# Patient Record
Sex: Male | Born: 1965 | Race: Black or African American | Hispanic: No | Smoking: Never smoker
Health system: Southern US, Community
[De-identification: ages and names within clinical notes are randomized; demographics above are authoritative.]

## PROBLEM LIST (undated history)

## (undated) DIAGNOSIS — M5430 Sciatica, unspecified side: Secondary | ICD-10-CM

## (undated) HISTORY — PX: BACK SURGERY: SHX140

## (undated) HISTORY — PX: KNEE SURGERY: SHX244

---

## 2021-07-15 ENCOUNTER — Emergency Department (HOSPITAL_COMMUNITY)
Admission: EM | Admit: 2021-07-15 | Discharge: 2021-07-15 | Disposition: A | Discharge status: Home / Self Care | Payer: Medicare Other | Attending: Emergency Medicine | Admitting: Emergency Medicine

## 2021-07-15 ENCOUNTER — Encounter (HOSPITAL_COMMUNITY): Payer: Self-pay

## 2021-07-15 ENCOUNTER — Emergency Department (HOSPITAL_COMMUNITY): Payer: Medicare Other

## 2021-07-15 ENCOUNTER — Other Ambulatory Visit: Payer: Self-pay

## 2021-07-15 DIAGNOSIS — M79652 Pain in left thigh: Secondary | ICD-10-CM | POA: Diagnosis not present

## 2021-07-15 DIAGNOSIS — M5432 Sciatica, left side: Secondary | ICD-10-CM | POA: Insufficient documentation

## 2021-07-15 DIAGNOSIS — M25552 Pain in left hip: Secondary | ICD-10-CM | POA: Insufficient documentation

## 2021-07-15 HISTORY — DX: Sciatica, unspecified side: M54.30

## 2021-07-15 MED ORDER — KETOROLAC TROMETHAMINE 60 MG/2ML IM SOLN
60.0000 mg | Freq: Once | INTRAMUSCULAR | Status: AC
  Administered 2021-07-15: 60 mg via INTRAMUSCULAR
  Filled 2021-07-15: qty 2

## 2021-07-15 MED ORDER — PREDNISONE 20 MG PO TABS: 40.0000 mg | ORAL_TABLET | Freq: Every day | ORAL | 0 refills | Status: AC

## 2021-07-15 MED ORDER — CYCLOBENZAPRINE HCL 10 MG PO TABS
10.0000 mg | ORAL_TABLET | Freq: Once | ORAL | Status: AC
  Administered 2021-07-15: 10 mg via ORAL
  Filled 2021-07-15: qty 1

## 2021-07-15 NOTE — Discharge Instructions (Addendum)
You came to the emergency department today to be evaluated for your left leg/back pain.  Your physical exam was reassuring.  The x-ray of your left hip showed no broken bones or dislocations.  Your pain is likely due to sciatica.  Due to this I have given you prescription for prednisone.  Please take this medication as prescribed.  I have given you information to follow-up with an orthopedic provider.  Please schedule an appointment if you continue to have back pain.  Please follow-up with your primary care provider as they other ones prescribing her gabapentin, Flexeril, and meloxicam.  I have given you a prescription for steroids today.  Some common side effects include feelings of extra energy, feeling warm, increased appetite, and stomach upset.  If you are diabetic your sugars may run higher than usual.    Get help right away if: You are not able to control when you urinate or have bowel movements (incontinence). You have: Weakness in your lower back, pelvis, buttocks, or legs that gets worse. Redness or swelling of your back. A burning sensation when you urinate.

## 2021-07-15 NOTE — ED Triage Notes (Signed)
Patient c/o left hip pain that radiates down the left leg. Patient reports a history of sciatica.  Patient states he has been taking gabapentin, tramadol, meloxicam with no relief.

## 2021-07-15 NOTE — ED Provider Notes (Signed)
[facility]-EMERGENCY DEPT Provider Note   CSN: [id number] Arrival date & time: 07/15/21  1544     History Chief Complaint  Patient presents with   Hip Pain    Spencer Lamb is a 55 y.o. male with reported history of sciatica, previous lumbar fusion.  Presents to the emergency department with a chief complaint of left hip pain.  Patient reports that pain started on Monday.  Pain has been intermittent since then.  Pain is worse with movement and walking.  Pain improves when he is laying down.  Patient has had minimal relief with prescribed gabapentin, tramadol, and meloxicam.  Patient rates pain 8/10 on the pain scale.  Patient describes pain as "aching."  Pain radiates to left thigh.  Patient denies any recent falls or injuries.  Patient reports that pain feels similar to previous episodes of sciatica.     Hip Pain Pertinent negatives include no chest pain, no abdominal pain, no headaches and no shortness of breath.      Past Medical History:  Diagnosis Date   Sciatica     There are no problems to display for this patient.   Past Surgical History:  Procedure Laterality Date   BACK SURGERY     KNEE SURGERY Right        Family History  Problem Relation Age of Onset   Depression Mother    Heart attack Mother    Cancer Father     Social History   Tobacco Use   Smoking status: Never   Smokeless tobacco: Never  Vaping Use   Vaping Use: Never used  Substance Use Topics   Alcohol use: Yes    Comment: occasionally   Drug use: Never    Home Medications Prior to Admission medications   Not on File    Allergies    Patient has no known allergies.  Review of Systems   Review of Systems  Constitutional:  Negative for chills and fever.  Eyes:  Negative for visual disturbance.  Respiratory:  Negative for shortness of breath.   Cardiovascular:  Negative for chest pain.  Gastrointestinal:  Negative for abdominal pain, nausea and vomiting.   Genitourinary:  Negative for difficulty urinating.  Musculoskeletal:  Positive for arthralgias and myalgias. Negative for back pain, gait problem, joint swelling and neck pain.  Skin:  Negative for color change, pallor, rash and wound.  Neurological:  Negative for dizziness, syncope, weakness, light-headedness, numbness and headaches.  Psychiatric/Behavioral:  Negative for confusion.    Physical Exam Updated Vital Signs BP (!) 148/106 (BP Location: Left Arm)   Pulse 86   Temp 98.5 F (36.9 C) (Oral)   Resp 16   Ht 5\' 11"  (1.803 m)   Wt 118.8 kg   SpO2 95%   BMI 36.54 kg/m   Physical Exam Vitals and nursing note reviewed.  Constitutional:      General: He is not in acute distress.    Appearance: He is not ill-appearing, toxic-appearing or diaphoretic.  HENT:     Head: Normocephalic.  Eyes:     General: No scleral icterus.       Right eye: No discharge.        Left eye: No discharge.  Cardiovascular:     Rate and Rhythm: Normal rate.     Pulses:          Dorsalis pedis pulses are 2+ on the right side and 2+ on the left side.  Pulmonary:     Effort:  Pulmonary effort is normal.  Musculoskeletal:     Cervical back: No swelling, edema, deformity, erythema, signs of trauma, lacerations, rigidity, spasms, torticollis, tenderness, bony tenderness or crepitus. No pain with movement. Normal range of motion.     Thoracic back: No swelling, edema, deformity, signs of trauma, lacerations, spasms, tenderness or bony tenderness.     Lumbar back: No swelling, edema, deformity, signs of trauma, lacerations, spasms, tenderness or bony tenderness. Positive left straight leg raise test. Negative right straight leg raise test.     Right hip: No deformity, lacerations, tenderness, bony tenderness or crepitus.     Left hip: Tenderness and bony tenderness present. No deformity, lacerations or crepitus.     Right upper leg: Normal.     Left upper leg: Tenderness present. No swelling, edema,  deformity, lacerations or bony tenderness.     Right knee: No swelling, deformity, effusion, erythema, ecchymosis, lacerations, bony tenderness or crepitus. Normal range of motion. No tenderness. Normal alignment.     Left knee: No swelling, deformity, effusion, erythema, ecchymosis, lacerations, bony tenderness or crepitus. Normal range of motion. No tenderness. Normal alignment.     Right lower leg: Normal.     Left lower leg: Normal.     Right ankle: No swelling, deformity, ecchymosis or lacerations. No tenderness. Normal range of motion.     Left ankle: No swelling, deformity, ecchymosis or lacerations. No tenderness.     Right foot: Normal range of motion and normal capillary refill. No swelling, deformity, laceration, tenderness, bony tenderness or crepitus. Normal pulse.     Left foot: Normal range of motion and normal capillary refill. No swelling, deformity, laceration, tenderness, bony tenderness or crepitus. Normal pulse.     Comments: Patient has tenderness to superior aspect of left buttocks.  No midline tenderness or deformity to cervical, thoracic, or lumbar spine.  Patient has well-healed surgical scar scar to midline lumbar back.  Feet:     Right foot:     Skin integrity: Skin integrity normal.     Left foot:     Skin integrity: Skin integrity normal.  Skin:    General: Skin is warm and dry.  Neurological:     General: No focal deficit present.     Mental Status: He is alert.     GCS: GCS eye subscore is 4. GCS verbal subscore is 5. GCS motor subscore is 6.  Psychiatric:        Behavior: Behavior is cooperative.    ED Results / Procedures / Treatments   Labs (all labs ordered are listed, but only abnormal results are displayed) Labs Reviewed - No data to display  EKG None  Radiology DG Hip Unilat W or Wo Pelvis 2-3 Views Left  Result Date: 07/15/2021 CLINICAL DATA:  Left posterior hip pain. EXAM: DG HIP (WITH OR WITHOUT PELVIS) 2-3V LEFT COMPARISON:  None.  FINDINGS: There is no acute fracture or dislocation. Joint spaces are well maintained. There is minimal degenerative osteophyte formation of the superior acetabuli bilaterally. No focal osseous lesions are identified. Soft tissues are within normal limits. IMPRESSION: Negative. Electronically Signed   By: Darliss Cheney M.D.   On: 07/15/2021 17:47    Procedures Procedures   Medications Ordered in ED Medications  cyclobenzaprine (FLEXERIL) tablet 10 mg (10 mg Oral Given 07/15/21 1734)  ketorolac (TORADOL) injection 60 mg (60 mg Intramuscular Given 07/15/21 1735)    ED Course  I have reviewed the triage vital signs and the nursing notes.  Pertinent  labs & imaging results that were available during my care of the patient were reviewed by me and considered in my medical decision making (see chart for details).    MDM Rules/Calculators/A&P                           Alert 55 year old male no acute distress, nontoxic-appearing.  Presents to ED with chief complaint of left hip pain.  No recent falls or injuries.  No midline tenderness or deformity to cervical, thoracic, lumbar spine.  Patient has tenderness to superior aspect of left buttocks and left thigh.  Will obtain x-ray imaging of left hip to evaluate for possible fracture or dislocation.  We will give patient Flexeril and Toradol.  If x-ray imaging shows no fracture or dislocation we will give patient for course of prednisone to treat for possible sciatica.  Patient reports improvement in his symptoms after receiving Toradol and Flexeril.  X-ray imaging shows no acute osseous abnormality.  Patient reports he has prescription for Flexeril.  Will prescribe patient with short course of prednisone.  Patient given information to follow-up with orthopedic provider.  Patient will follow-up with PCP as prescriber for Flexeril and gabapentin.  Discussed results, findings, treatment and follow up. Patient advised of return precautions. Patient  verbalized understanding and agreed with plan.   Final Clinical Impression(s) / ED Diagnoses Final diagnoses:  Sciatica of left side    Rx / DC Orders ED Discharge Orders          Ordered    predniSONE (DELTASONE) 20 MG tablet  Daily with breakfast        07/15/21 1815             Haskel Schroeder, PA-C 07/15/21 2359    [name], MD 07/16/21 1108

## 2021-07-15 NOTE — ED Provider Notes (Signed)
Emergency Medicine Provider Triage Evaluation Note  Spencer Lamb , a 55 y.o. male  was evaluated in triage.  Pt complains of left hip pain.  Pain started on Monday.  Patient denies any recent falls or injuries.  Endorses history of sciatica.  Reports minimal relief with gabapentin, tramadol, and meloxicam.  Review of Systems  Positive: Left hip pain Negative: Numbness, weakness, rash, erythema  Physical Exam  BP (!) 148/106 (BP Location: Left Arm)   Pulse 86   Temp 98.5 F (36.9 C) (Oral)   Resp 16   SpO2 95%  Gen:   Awake, no distress   Resp:  Normal effort  MSK:   Moves extremities without difficulty, miss to superior aspect of left buttocks. Other:    Male RN present in room as chaperone.  Medical Decision Making  Medically screening exam initiated at 4:25 PM.  Appropriate orders placed.  Spencer Lamb was informed that the remainder of the evaluation will be completed by another provider, this initial triage assessment does not replace that evaluation, and the importance of remaining in the ED until their evaluation is complete.  The patient appears stable so that the remainder of the work up may be completed by another provider.      Haskel Schroeder, PA-C 07/15/21 1627    [name], MD 07/16/21 1108

## 2023-08-09 IMAGING — CR DG HIP (WITH OR WITHOUT PELVIS) 2-3V*L*
3 series · 3 of 3 positions shown · non-contrast
Comparison: None.

CLINICAL DATA: Left posterior hip pain.

EXAM:
DG HIP (WITH OR WITHOUT PELVIS) 2-3V LEFT

[t pelvis ap]
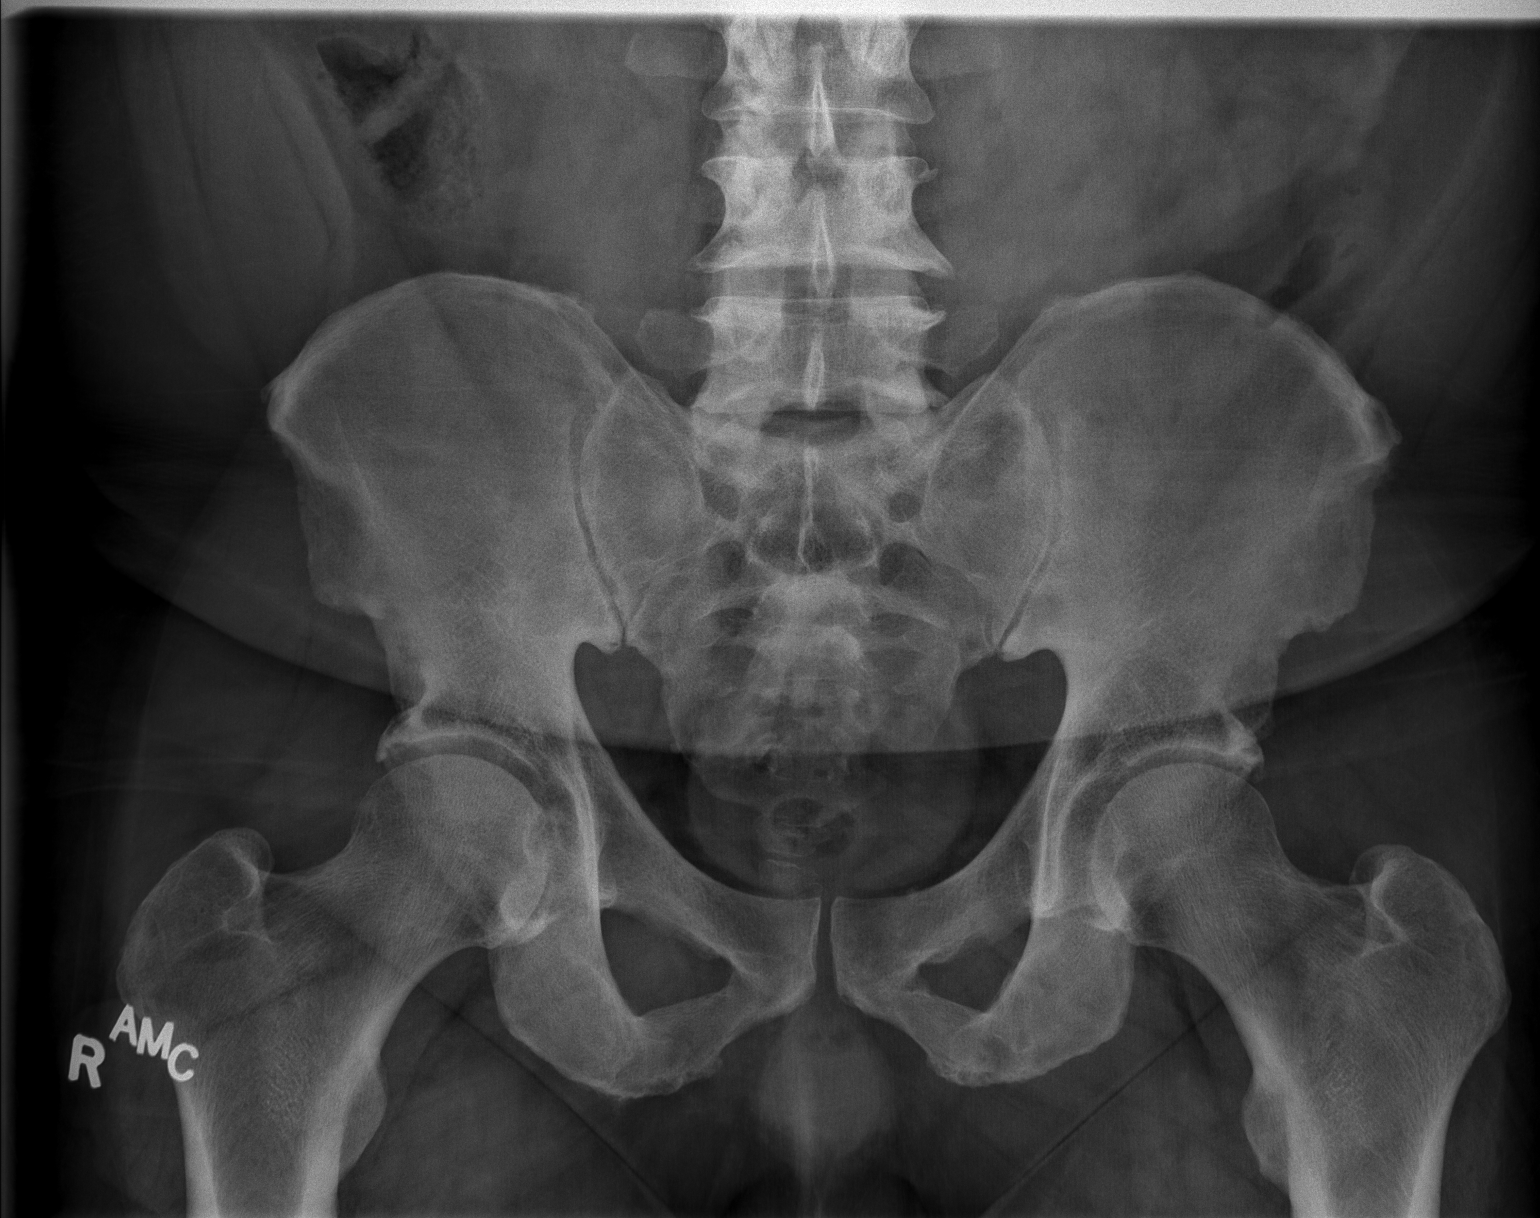

[t hip ap left]
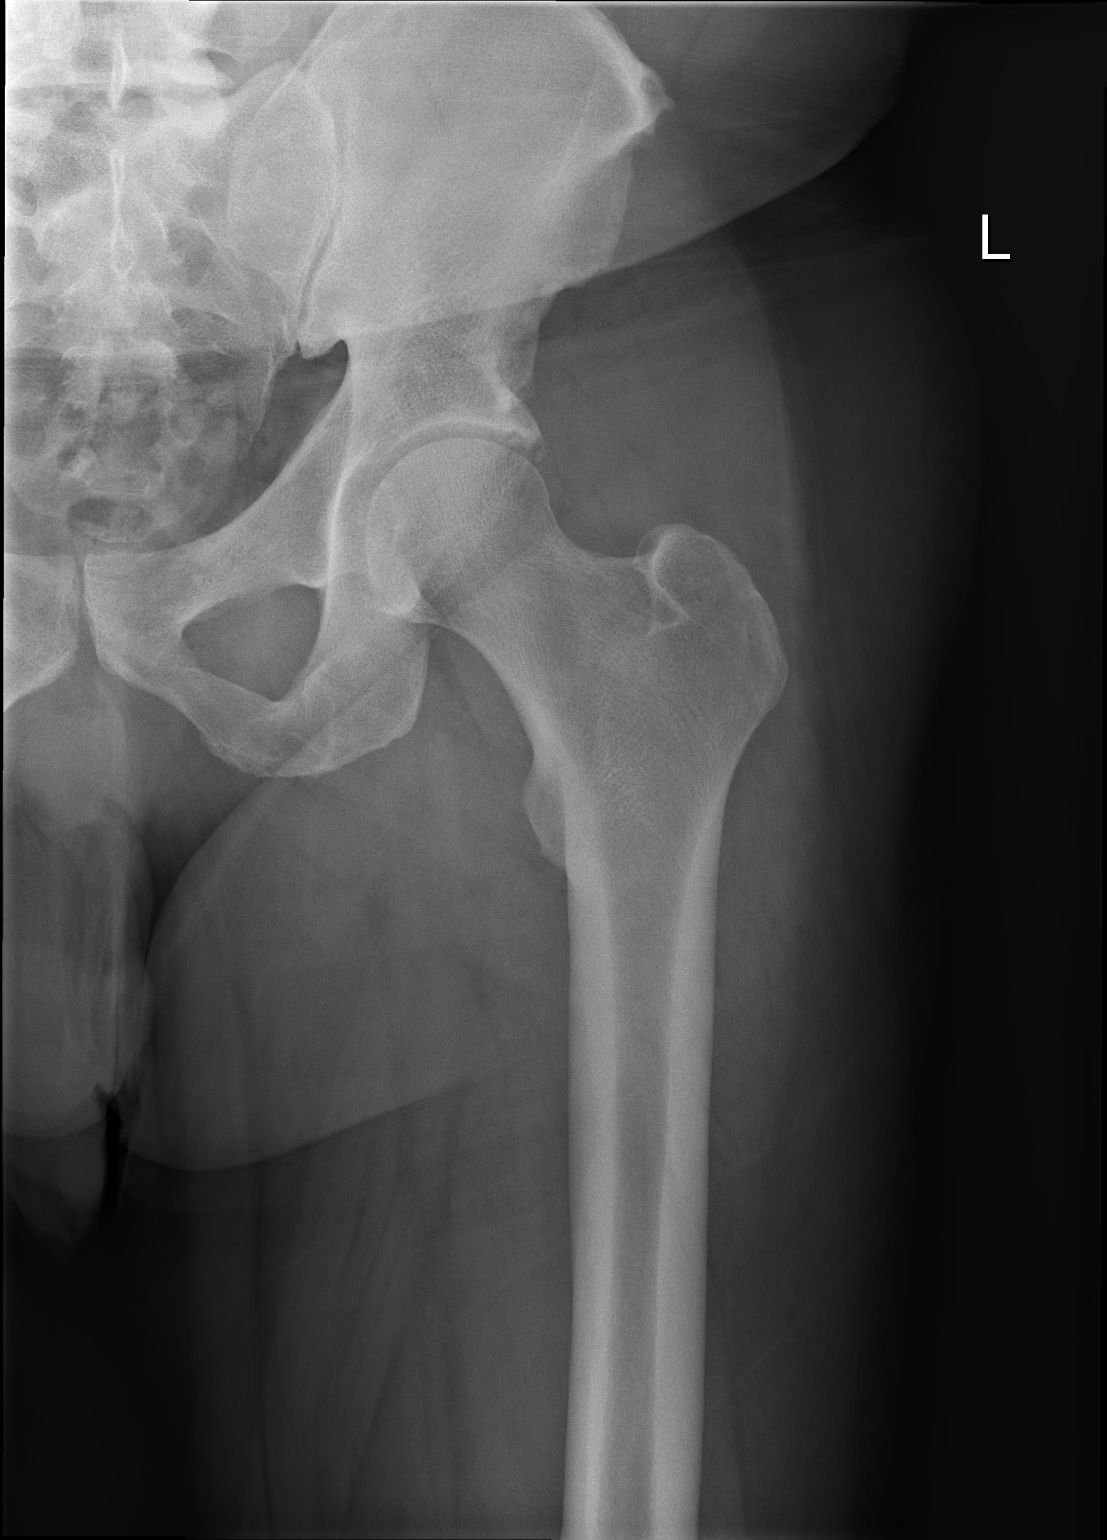

[t hip frog leg left]
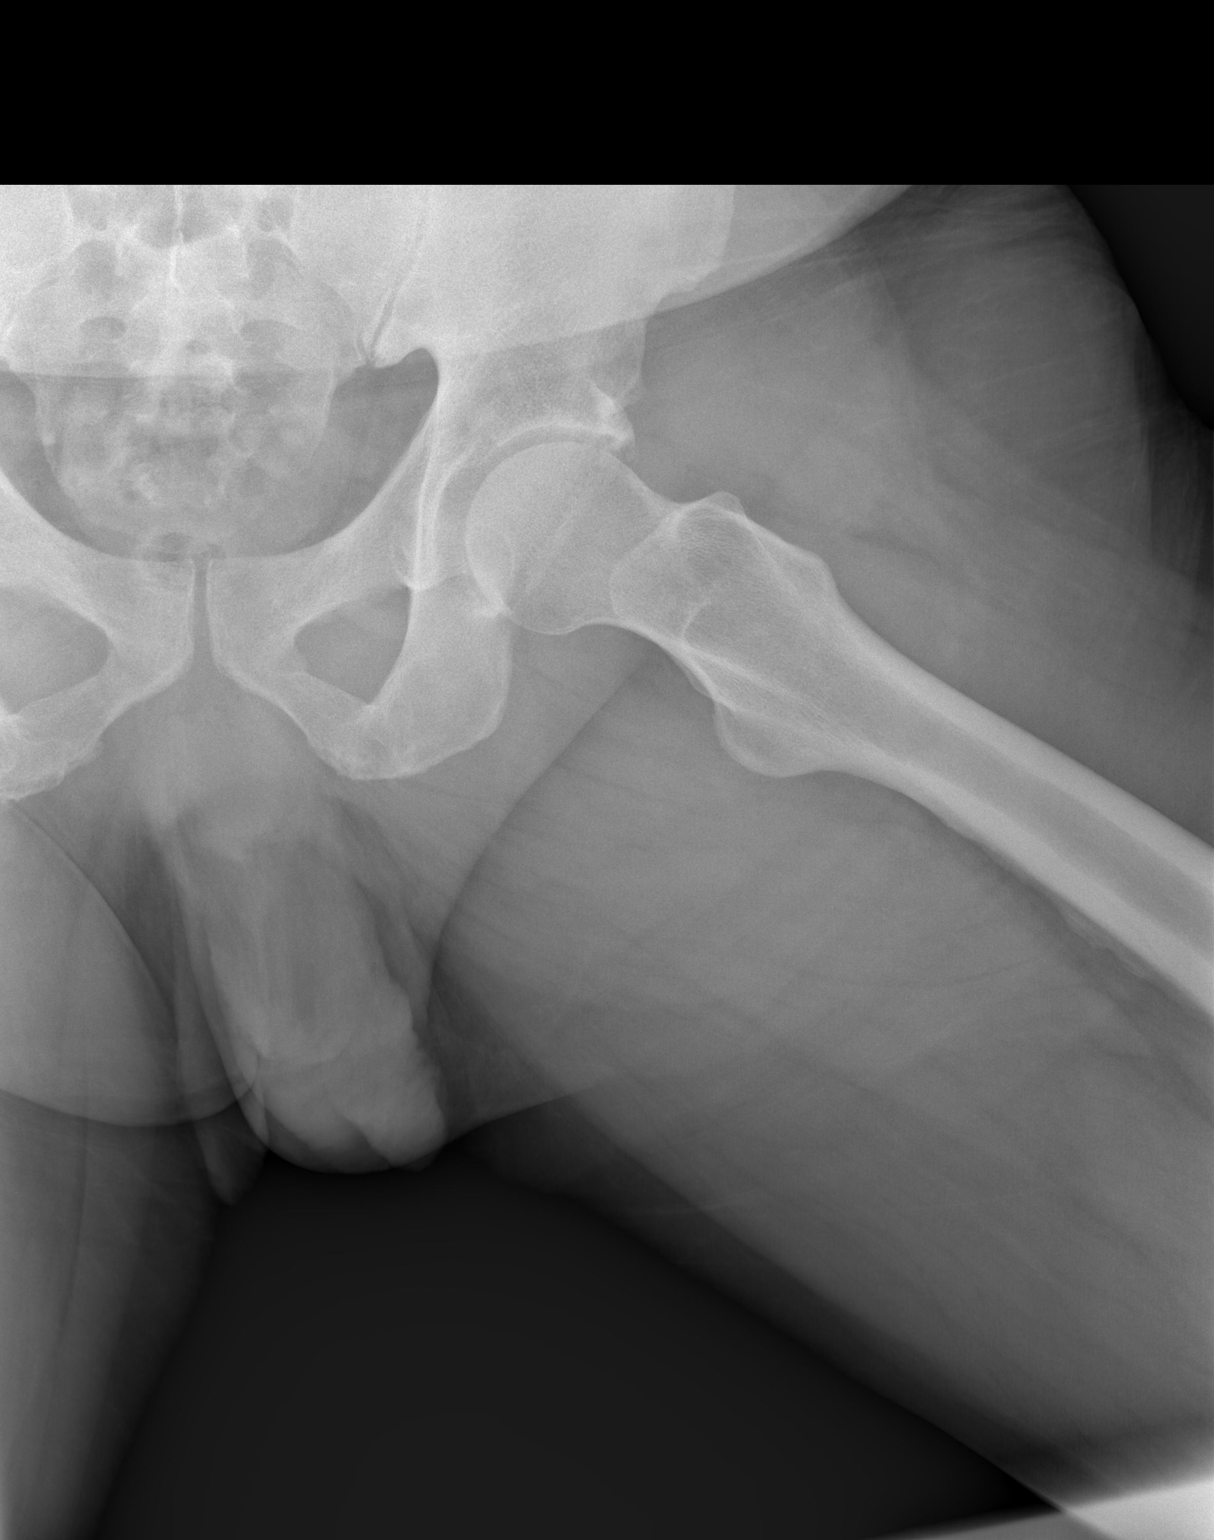

[3 of 3 positions shown; findings below may reference images not displayed]

FINDINGS: There is no acute fracture or dislocation. Joint spaces are well
maintained. There is minimal degenerative osteophyte formation of
the superior acetabuli bilaterally. No focal osseous lesions are
identified. Soft tissues are within normal limits.
IMPRESSION: Negative.

## 2024-02-27 ENCOUNTER — Other Ambulatory Visit (HOSPITAL_COMMUNITY): Payer: Self-pay

## 2024-03-03 ENCOUNTER — Other Ambulatory Visit (HOSPITAL_COMMUNITY): Payer: Self-pay

## 2024-03-03 ENCOUNTER — Other Ambulatory Visit: Payer: Self-pay

## 2024-03-03 MED ORDER — LISINOPRIL-HYDROCHLOROTHIAZIDE 10-12.5 MG PO TABS
1.0000 | ORAL_TABLET | Freq: Every day | ORAL | 1 refills | Status: AC
  Filled 2024-03-03: qty 90, 90d supply, fill #0
  Filled 2024-08-12: qty 90, 90d supply, fill #1

## 2024-03-03 MED ORDER — ALLOPURINOL 300 MG PO TABS
300.0000 mg | ORAL_TABLET | Freq: Every day | ORAL | 0 refills | Status: AC
  Filled 2024-03-03 (×2): qty 40, 90d supply, fill #0

## 2024-03-03 MED ORDER — FAMOTIDINE 20 MG PO TABS
20.0000 mg | ORAL_TABLET | Freq: Every day | ORAL | 0 refills | Status: DC
  Filled 2024-03-03: qty 90, 90d supply, fill #0

## 2024-04-02 ENCOUNTER — Other Ambulatory Visit (HOSPITAL_COMMUNITY): Payer: Self-pay

## 2024-04-02 MED ORDER — SIMVASTATIN 40 MG PO TABS
40.0000 mg | ORAL_TABLET | Freq: Every day | ORAL | 0 refills | Status: DC
  Filled 2024-04-02: qty 90, 90d supply, fill #0

## 2024-05-02 ENCOUNTER — Encounter: Payer: Self-pay | Admitting: Internal Medicine

## 2024-05-23 ENCOUNTER — Other Ambulatory Visit: Payer: Self-pay

## 2024-05-23 ENCOUNTER — Other Ambulatory Visit (HOSPITAL_COMMUNITY): Payer: Self-pay

## 2024-05-23 MED ORDER — ALLOPURINOL 300 MG PO TABS
300.0000 mg | ORAL_TABLET | ORAL | 3 refills | Status: AC
  Filled 2024-05-23: qty 36, 84d supply, fill #0
  Filled 2024-05-23: qty 12, 84d supply, fill #0
  Filled 2024-06-09: qty 36, 84d supply, fill #0
  Filled 2024-09-04: qty 36, 84d supply, fill #1

## 2024-05-23 MED ORDER — FAMOTIDINE 20 MG PO TABS
20.0000 mg | ORAL_TABLET | Freq: Every day | ORAL | 0 refills | Status: DC
  Filled 2024-05-23 – 2024-06-09 (×3): qty 90, 90d supply, fill #0

## 2024-05-23 MED ORDER — MECLIZINE HCL 25 MG PO TABS
25.0000 mg | ORAL_TABLET | Freq: Every day | ORAL | 5 refills | Status: AC | PRN
  Filled 2024-05-23 – 2024-06-09 (×3): qty 60, 60d supply, fill #0

## 2024-05-28 ENCOUNTER — Other Ambulatory Visit: Payer: Self-pay

## 2024-06-09 ENCOUNTER — Other Ambulatory Visit (HOSPITAL_COMMUNITY): Payer: Self-pay

## 2024-06-23 ENCOUNTER — Other Ambulatory Visit (HOSPITAL_COMMUNITY): Payer: Self-pay

## 2024-06-23 MED ORDER — HYDROCODONE-ACETAMINOPHEN 5-325 MG PO TABS
1.0000 | ORAL_TABLET | Freq: Four times a day (QID) | ORAL | 0 refills | Status: AC
  Filled 2024-06-23 (×2): qty 20, 5d supply, fill #0

## 2024-07-07 ENCOUNTER — Other Ambulatory Visit (HOSPITAL_COMMUNITY): Payer: Self-pay

## 2024-07-07 ENCOUNTER — Other Ambulatory Visit: Payer: Self-pay

## 2024-07-07 MED ORDER — SIMVASTATIN 40 MG PO TABS
40.0000 mg | ORAL_TABLET | Freq: Every day | ORAL | 0 refills | Status: AC
Start: 2024-07-07 — End: ?
  Filled 2024-07-07: qty 90, 90d supply, fill #0

## 2024-07-09 ENCOUNTER — Other Ambulatory Visit: Payer: Self-pay

## 2024-07-09 ENCOUNTER — Other Ambulatory Visit (HOSPITAL_COMMUNITY): Payer: Self-pay

## 2024-07-09 MED ORDER — METFORMIN HCL 500 MG PO TABS
ORAL_TABLET | ORAL | 0 refills | Status: AC
  Filled 2024-07-09: qty 180, 90d supply, fill #0

## 2024-08-12 ENCOUNTER — Other Ambulatory Visit (HOSPITAL_COMMUNITY): Payer: Self-pay

## 2024-09-04 ENCOUNTER — Other Ambulatory Visit (HOSPITAL_COMMUNITY): Payer: Self-pay

## 2024-10-01 ENCOUNTER — Other Ambulatory Visit (HOSPITAL_COMMUNITY): Payer: Self-pay

## 2024-10-01 MED ORDER — SIMVASTATIN 40 MG PO TABS
40.0000 mg | ORAL_TABLET | Freq: Every day | ORAL | 3 refills | Status: AC
  Filled 2024-10-01: qty 90, 90d supply, fill #0

## 2024-10-07 ENCOUNTER — Other Ambulatory Visit (HOSPITAL_COMMUNITY): Payer: Self-pay

## 2024-10-07 MED ORDER — FAMOTIDINE 20 MG PO TABS
20.0000 mg | ORAL_TABLET | Freq: Every day | ORAL | 0 refills | Status: AC
  Filled 2024-10-07: qty 90, 90d supply, fill #0

## 2024-10-15 ENCOUNTER — Other Ambulatory Visit (HOSPITAL_COMMUNITY): Payer: Self-pay

## 2024-11-10 ENCOUNTER — Other Ambulatory Visit (HOSPITAL_COMMUNITY): Payer: Self-pay
